# Patient Record
Sex: Male | Born: 1965 | Race: Black or African American | Hispanic: No | Marital: Single | Smoking: Current some day smoker
Health system: Southern US, Community
[De-identification: ages and names within clinical notes are randomized; demographics above are authoritative.]

## PROBLEM LIST (undated history)

## (undated) DIAGNOSIS — I10 Essential (primary) hypertension: Secondary | ICD-10-CM

## (undated) DIAGNOSIS — F419 Anxiety disorder, unspecified: Secondary | ICD-10-CM

## (undated) DIAGNOSIS — J449 Chronic obstructive pulmonary disease, unspecified: Secondary | ICD-10-CM

## (undated) DIAGNOSIS — I509 Heart failure, unspecified: Secondary | ICD-10-CM

## (undated) HISTORY — PX: CORONARY ANGIOPLASTY WITH STENT PLACEMENT: SHX49

---

## 2019-09-27 DIAGNOSIS — I2510 Atherosclerotic heart disease of native coronary artery without angina pectoris: Secondary | ICD-10-CM

## 2019-09-27 DIAGNOSIS — Z95810 Presence of automatic (implantable) cardiac defibrillator: Secondary | ICD-10-CM

## 2019-09-27 DIAGNOSIS — Z720 Tobacco use: Secondary | ICD-10-CM

## 2019-09-27 DIAGNOSIS — J449 Chronic obstructive pulmonary disease, unspecified: Secondary | ICD-10-CM

## 2019-09-27 DIAGNOSIS — R079 Chest pain, unspecified: Secondary | ICD-10-CM

## 2019-09-27 DIAGNOSIS — D72829 Elevated white blood cell count, unspecified: Secondary | ICD-10-CM

## 2019-09-27 DIAGNOSIS — I255 Ischemic cardiomyopathy: Secondary | ICD-10-CM

## 2019-09-27 DIAGNOSIS — E785 Hyperlipidemia, unspecified: Secondary | ICD-10-CM

## 2019-09-27 DIAGNOSIS — I361 Nonrheumatic tricuspid (valve) insufficiency: Secondary | ICD-10-CM

## 2019-09-28 ENCOUNTER — Other Ambulatory Visit: Payer: Self-pay

## 2019-09-28 ENCOUNTER — Emergency Department (HOSPITAL_COMMUNITY): Payer: Medicaid Other

## 2019-09-28 ENCOUNTER — Encounter (HOSPITAL_COMMUNITY): Payer: Self-pay | Admitting: Emergency Medicine

## 2019-09-28 ENCOUNTER — Emergency Department (HOSPITAL_COMMUNITY)
Admission: EM | Admit: 2019-09-28 | Discharge: 2019-09-28 | Disposition: A | Discharge status: Home / Self Care | Payer: Medicaid Other | Attending: Emergency Medicine | Admitting: Emergency Medicine

## 2019-09-28 DIAGNOSIS — I509 Heart failure, unspecified: Secondary | ICD-10-CM | POA: Insufficient documentation

## 2019-09-28 DIAGNOSIS — D72829 Elevated white blood cell count, unspecified: Secondary | ICD-10-CM | POA: Diagnosis not present

## 2019-09-28 DIAGNOSIS — R079 Chest pain, unspecified: Secondary | ICD-10-CM | POA: Diagnosis not present

## 2019-09-28 DIAGNOSIS — I255 Ischemic cardiomyopathy: Secondary | ICD-10-CM | POA: Diagnosis not present

## 2019-09-28 DIAGNOSIS — Z95810 Presence of automatic (implantable) cardiac defibrillator: Secondary | ICD-10-CM | POA: Diagnosis not present

## 2019-09-28 DIAGNOSIS — R0602 Shortness of breath: Secondary | ICD-10-CM | POA: Diagnosis not present

## 2019-09-28 DIAGNOSIS — I110 Hypertensive heart disease with heart failure: Secondary | ICD-10-CM | POA: Diagnosis not present

## 2019-09-28 DIAGNOSIS — J449 Chronic obstructive pulmonary disease, unspecified: Secondary | ICD-10-CM | POA: Diagnosis not present

## 2019-09-28 DIAGNOSIS — Z7982 Long term (current) use of aspirin: Secondary | ICD-10-CM | POA: Insufficient documentation

## 2019-09-28 DIAGNOSIS — Z79899 Other long term (current) drug therapy: Secondary | ICD-10-CM | POA: Insufficient documentation

## 2019-09-28 DIAGNOSIS — F17210 Nicotine dependence, cigarettes, uncomplicated: Secondary | ICD-10-CM | POA: Insufficient documentation

## 2019-09-28 DIAGNOSIS — R072 Precordial pain: Secondary | ICD-10-CM | POA: Insufficient documentation

## 2019-09-28 DIAGNOSIS — I2510 Atherosclerotic heart disease of native coronary artery without angina pectoris: Secondary | ICD-10-CM | POA: Diagnosis not present

## 2019-09-28 DIAGNOSIS — R0789 Other chest pain: Secondary | ICD-10-CM | POA: Diagnosis present

## 2019-09-28 HISTORY — DX: Essential (primary) hypertension: I10

## 2019-09-28 HISTORY — DX: Heart failure, unspecified: I50.9

## 2019-09-28 HISTORY — DX: Chronic obstructive pulmonary disease, unspecified: J44.9

## 2019-09-28 HISTORY — DX: Anxiety disorder, unspecified: F41.9

## 2019-09-28 LAB — BASIC METABOLIC PANEL
Anion gap: 9 (ref 5–15)
BUN: 13 mg/dL (ref 6–20)
CO2: 27 mmol/L (ref 22–32)
Calcium: 9.3 mg/dL (ref 8.9–10.3)
Chloride: 101 mmol/L (ref 98–111)
Creatinine, Ser: 0.93 mg/dL (ref 0.61–1.24)
GFR calc Af Amer: >60 mL/min (ref >60)
GFR calc non Af Amer: >60 mL/min (ref >60)
Glucose, Bld: 91 mg/dL (ref 70–99)
Potassium: 3.8 mmol/L (ref 3.5–5.1)
Sodium: 137 mmol/L (ref 135–145)

## 2019-09-28 LAB — CBC
HCT: 47.3 % (ref 39.0–52.0)
Hemoglobin: 14.9 g/dL (ref 13.0–17.0)
MCH: 27.5 pg (ref 26.0–34.0)
MCHC: 31.5 g/dL (ref 30.0–36.0)
MCV: 87.4 fL (ref 80.0–100.0)
Platelets: 309 10*3/uL (ref 150–400)
RBC: 5.41 MIL/uL (ref 4.22–5.81)
RDW: 15.6 % — ABNORMAL HIGH (ref 11.5–15.5)
WBC: 11 10*3/uL — ABNORMAL HIGH (ref 4.0–10.5)
nRBC: 0 % (ref 0.0–0.2)

## 2019-09-28 LAB — TROPONIN I (HIGH SENSITIVITY)
Troponin I (High Sensitivity): 6 ng/L (ref <18)
Troponin I (High Sensitivity): 6 ng/L (ref <18)

## 2019-09-28 MED ORDER — METOCLOPRAMIDE HCL 5 MG/ML IJ SOLN
10.0000 mg | Freq: Once | INTRAMUSCULAR | Status: AC
  Administered 2019-09-28: 10 mg via INTRAVENOUS
  Filled 2019-09-28: qty 2

## 2019-09-28 MED ORDER — KETOROLAC TROMETHAMINE 15 MG/ML IJ SOLN
15.0000 mg | Freq: Once | INTRAMUSCULAR | Status: AC
  Administered 2019-09-28: 22:00:00 15 mg via INTRAVENOUS
  Filled 2019-09-28: qty 1

## 2019-09-28 MED ORDER — SODIUM CHLORIDE 0.9% FLUSH: 3.0000 mL | Freq: Once | INTRAVENOUS | Status: DC

## 2019-09-28 NOTE — ED Notes (Signed)
Patient reports shortness of breath with walking.

## 2019-09-28 NOTE — ED Notes (Signed)
Attempts to locate pt multiple times for triage. Pt called and located in back of ED lobby (separated area). RN advised. [employer]

## 2019-09-28 NOTE — ED Notes (Signed)
RN Lamar Laundry will collect labs at IV start

## 2019-09-28 NOTE — Discharge Instructions (Addendum)
We saw you in the ER for the chest pain/shortness of breath. All of our cardiac workup is normal, including labs, EKG and chest X-RAY are normal. We are not sure what is causing your discomfort, but we feel comfortable sending you home at this time. The workup in the ER is not complete, and you should follow up with your primary care doctor for further evaluation.  

## 2019-09-28 NOTE — ED Triage Notes (Signed)
The patient presents with complaints of mid chest pain that sometimes radiates to the left or right chest. He reports recently being discharged from [facility]. He reported to the RN that his chest pain had been linked to anxiety. It flared back up due to what the patient states as having a hard day and not taking his medication. 

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
## 2019-09-28 NOTE — ED Notes (Signed)
Pt states chest pain is resolving he rates pain as 5/10. Pt resting comfortably upon nurse entrance into room

## 2020-06-23 IMAGING — CR DG CHEST 2V
2 series · 2 of 2 positions shown · non-contrast
Comparison: 09/26/2019

CLINICAL DATA: Chest pain

EXAM:
CHEST - 2 VIEW

[w chest pa]
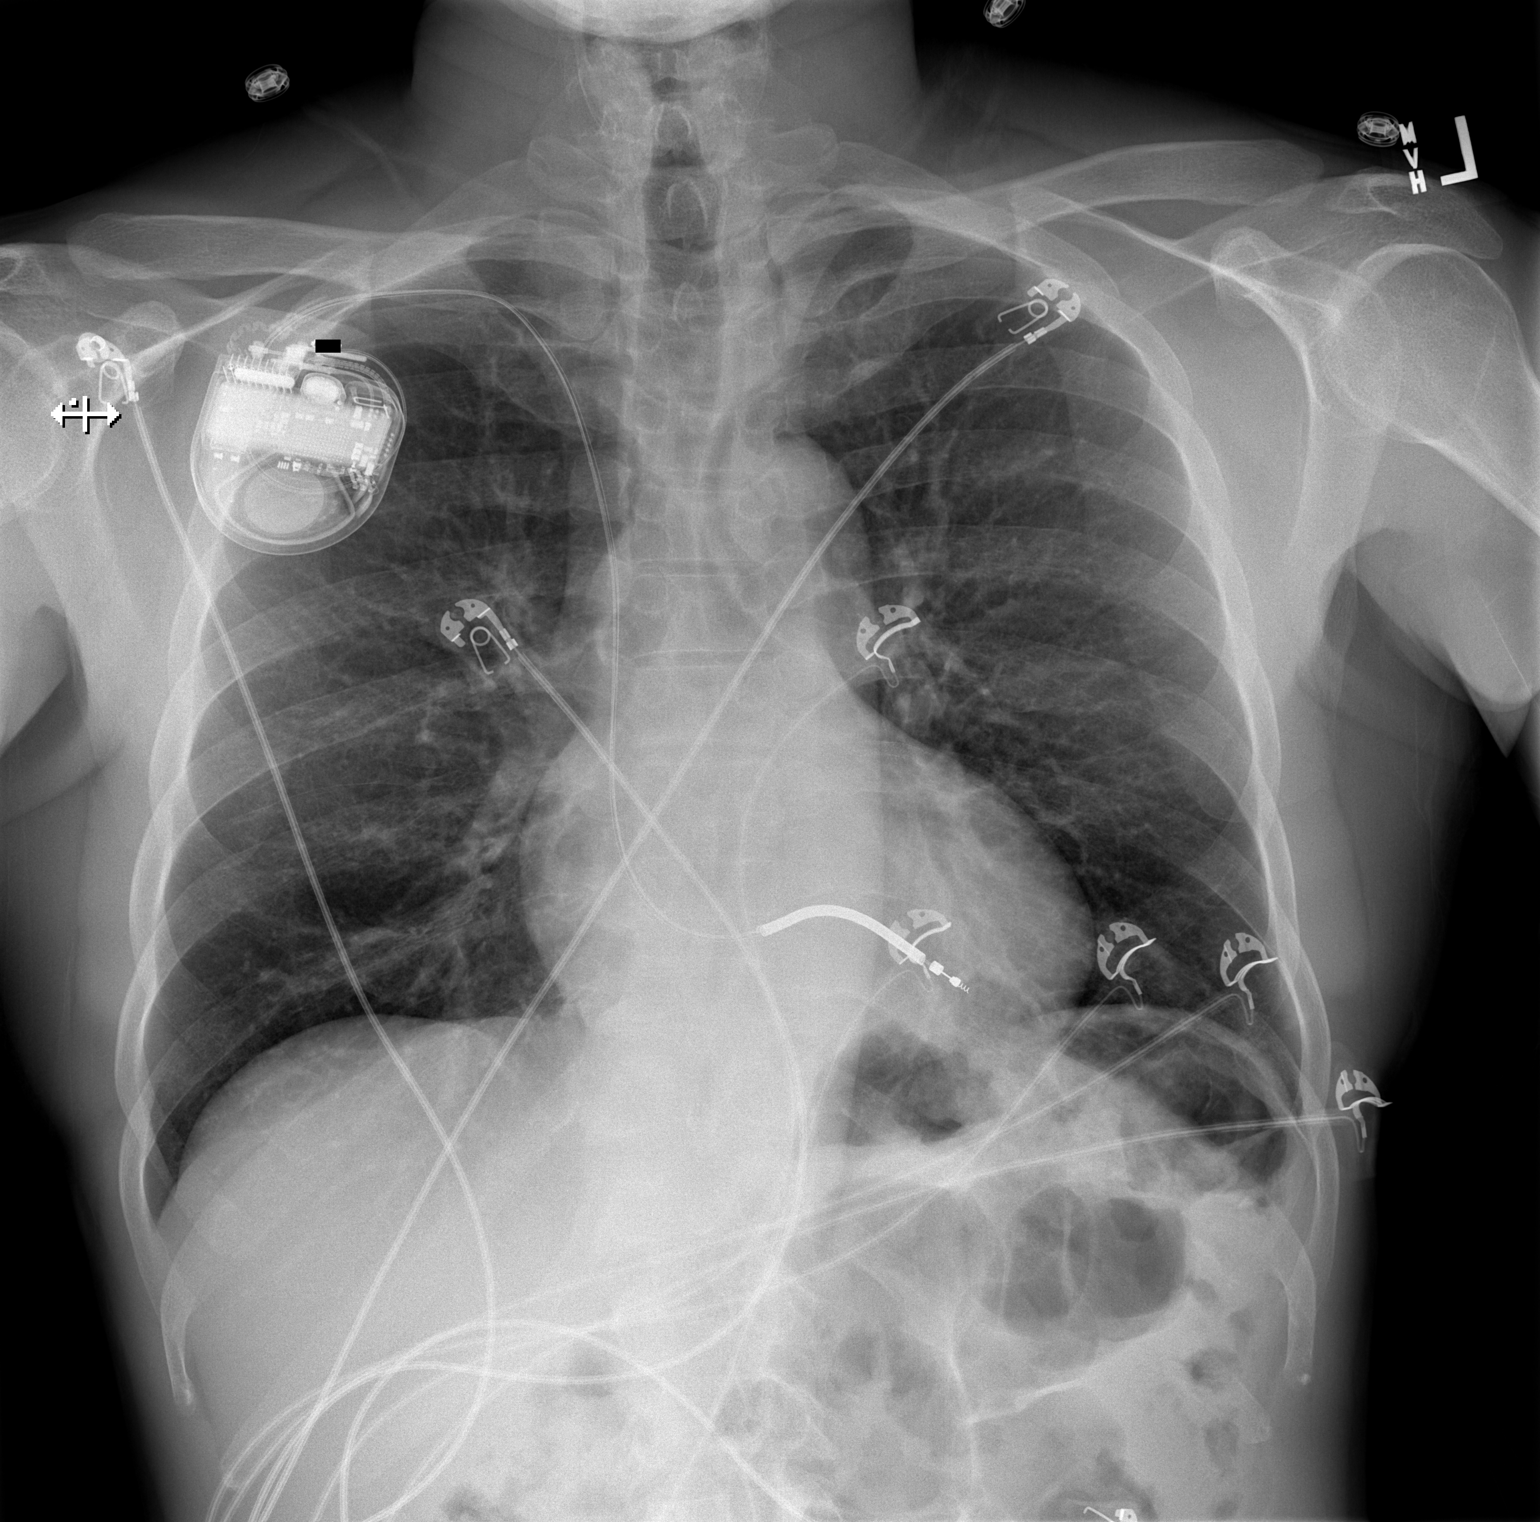

[w chest lat]
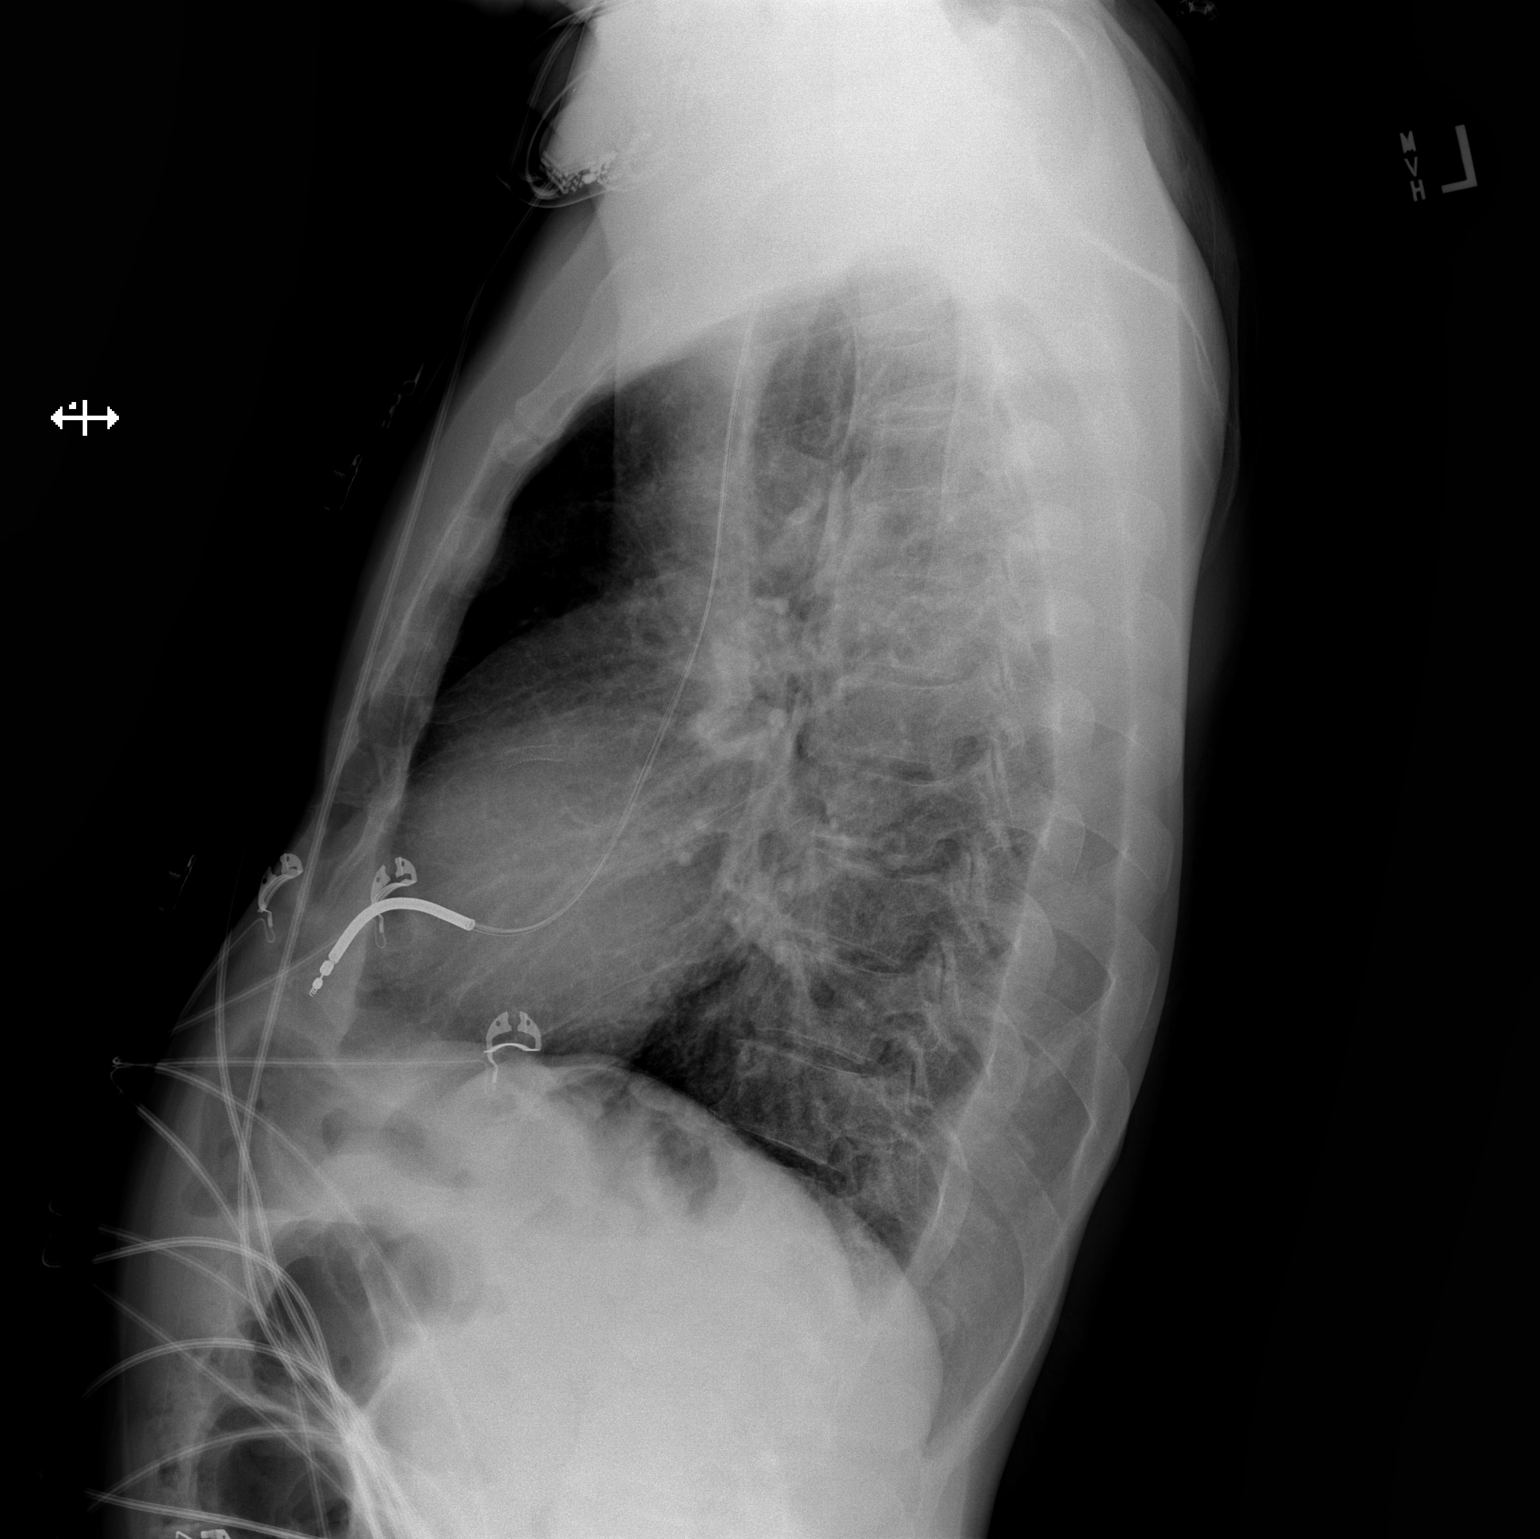

[2 of 2 positions shown; findings below may reference images not displayed]

FINDINGS: The heart size and mediastinal contours are within normal limits.
Right chest single lead pacer defibrillator. Both lungs are clear.
The visualized skeletal structures are unremarkable.
IMPRESSION: No acute abnormality of the lungs.

## 2021-03-01 ENCOUNTER — Emergency Department (HOSPITAL_COMMUNITY)
Admission: EM | Admit: 2021-03-01 | Discharge: 2021-03-01 | Disposition: A | Discharge status: Home / Self Care | Payer: Medicaid Other | Attending: Emergency Medicine | Admitting: Emergency Medicine

## 2021-03-01 ENCOUNTER — Encounter (HOSPITAL_COMMUNITY): Payer: Self-pay

## 2021-03-01 ENCOUNTER — Other Ambulatory Visit: Payer: Self-pay

## 2021-03-01 DIAGNOSIS — F17200 Nicotine dependence, unspecified, uncomplicated: Secondary | ICD-10-CM | POA: Diagnosis not present

## 2021-03-01 DIAGNOSIS — I509 Heart failure, unspecified: Secondary | ICD-10-CM | POA: Diagnosis not present

## 2021-03-01 DIAGNOSIS — Z7982 Long term (current) use of aspirin: Secondary | ICD-10-CM | POA: Diagnosis not present

## 2021-03-01 DIAGNOSIS — S71011A Laceration without foreign body, right hip, initial encounter: Secondary | ICD-10-CM | POA: Diagnosis not present

## 2021-03-01 DIAGNOSIS — I110 Hypertensive heart disease with heart failure: Secondary | ICD-10-CM | POA: Diagnosis not present

## 2021-03-01 DIAGNOSIS — E119 Type 2 diabetes mellitus without complications: Secondary | ICD-10-CM | POA: Diagnosis not present

## 2021-03-01 DIAGNOSIS — Z23 Encounter for immunization: Secondary | ICD-10-CM | POA: Diagnosis not present

## 2021-03-01 DIAGNOSIS — S79911A Unspecified injury of right hip, initial encounter: Secondary | ICD-10-CM | POA: Diagnosis present

## 2021-03-01 DIAGNOSIS — T148XXA Other injury of unspecified body region, initial encounter: Secondary | ICD-10-CM

## 2021-03-01 MED ORDER — LIDOCAINE-EPINEPHRINE (PF) 2 %-1:200000 IJ SOLN
10.0000 mL | Freq: Once | INTRAMUSCULAR | Status: AC
  Administered 2021-03-01: 10 mL
  Filled 2021-03-01: qty 20

## 2021-03-01 MED ORDER — TETANUS-DIPHTH-ACELL PERTUSSIS 5-2.5-18.5 LF-MCG/0.5 IM SUSY
0.5000 mL | PREFILLED_SYRINGE | Freq: Once | INTRAMUSCULAR | Status: AC
  Administered 2021-03-01: 0.5 mL via INTRAMUSCULAR
  Filled 2021-03-01: qty 0.5
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
## 2021-03-01 NOTE — Discharge Instructions (Addendum)
Leave the sutures in for 7 to 10 days.  After that you can have it removed in urgent care, primary care doctor, ER as needed.  You develop signs of infection please return back for further evaluation.  You can wash the area with soap and water as needed, but avoid any peroxide
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
